# Patient Record
Sex: Male | Born: 1950 | Race: Black or African American | Hispanic: No | Marital: Married | Smoking: Never smoker
Health system: Southern US, Community
[De-identification: ages and names within clinical notes are randomized; demographics above are authoritative.]

## PROBLEM LIST (undated history)

## (undated) DIAGNOSIS — I10 Essential (primary) hypertension: Secondary | ICD-10-CM

## (undated) DIAGNOSIS — M109 Gout, unspecified: Secondary | ICD-10-CM

## (undated) DIAGNOSIS — D649 Anemia, unspecified: Secondary | ICD-10-CM

## (undated) DIAGNOSIS — E119 Type 2 diabetes mellitus without complications: Secondary | ICD-10-CM

## (undated) HISTORY — PX: APPENDECTOMY: SHX54

---

## 2019-11-05 ENCOUNTER — Ambulatory Visit: Payer: Self-pay | Admitting: Hospice and Palliative Medicine

## 2019-11-13 ENCOUNTER — Other Ambulatory Visit: Payer: Self-pay | Admitting: Family Medicine

## 2019-11-13 DIAGNOSIS — R410 Disorientation, unspecified: Secondary | ICD-10-CM

## 2019-11-18 ENCOUNTER — Ambulatory Visit: Payer: Self-pay | Admitting: Hospice and Palliative Medicine

## 2019-11-25 ENCOUNTER — Other Ambulatory Visit: Payer: Self-pay

## 2019-11-25 ENCOUNTER — Ambulatory Visit
Admission: RE | Admit: 2019-11-25 | Discharge: 2019-11-25 | Disposition: A | Discharge status: Home / Self Care | Payer: Medicare HMO | Source: Ambulatory Visit | Attending: Family Medicine | Admitting: Family Medicine

## 2019-11-25 DIAGNOSIS — R410 Disorientation, unspecified: Secondary | ICD-10-CM | POA: Diagnosis present

## 2019-12-10 ENCOUNTER — Ambulatory Visit: Payer: Self-pay | Admitting: Family Medicine

## 2019-12-19 ENCOUNTER — Ambulatory Visit (INDEPENDENT_AMBULATORY_CARE_PROVIDER_SITE_OTHER): Payer: Medicare HMO | Admitting: Urology

## 2019-12-19 ENCOUNTER — Other Ambulatory Visit: Payer: Self-pay

## 2019-12-19 ENCOUNTER — Encounter: Payer: Self-pay | Admitting: Urology

## 2019-12-19 VITALS — BP 106/66 | HR 85 | Ht 68.0 in | Wt 180.0 lb

## 2019-12-19 DIAGNOSIS — N400 Enlarged prostate without lower urinary tract symptoms: Secondary | ICD-10-CM | POA: Diagnosis not present

## 2019-12-19 DIAGNOSIS — N393 Stress incontinence (female) (male): Secondary | ICD-10-CM | POA: Diagnosis not present

## 2019-12-19 DIAGNOSIS — Z8546 Personal history of malignant neoplasm of prostate: Secondary | ICD-10-CM

## 2019-12-19 LAB — BLADDER SCAN AMB NON-IMAGING: Scan Result: 2

## 2019-12-19 NOTE — Progress Notes (Signed)
12/19/2019 1:49 PM   Darryl Stafford [birth year] [id number]  Referring provider: Wilford Corner, PA-C [address] [place],  [place] [address]  Chief Complaint  Patient presents with   Benign Prostatic Hypertrophy    HPI: Darryl Stafford is a 69 y.o. male seen at the request of Debbra Riding, PA-C for evaluation of urinary incontinence.   Prior diagnosis prostate cancer 2009 and underwent radical prostatectomy in [place]  Records not available  Had post prostatectomy incontinence and underwent a "mesh procedure" in 2011  Improvement in his incontinence however recently with recurrent urinary incontinence  Presently wearing pads.  Although denies urine leakage with coughing or sneezing he does complain of unaware incontinence  Mild urinary frequency but denies urge incontinence no dysuria or gross hematuria  Denies flank, abdominal or pelvic pain  PSA drawn September 2021 was 0.03; no prior PSAs available for comparison   PMH:  Diabetes mellitus without complication (CMS-HCC)   History of cancer 2009  prostate   Hyperlipidemia   Hypertension   Surgical History:  prostate surgery 2009  cancer   Home Medications:  Allergies as of 12/19/2019   No Known Allergies     Medication List       Accurate as of December 19, 2019  1:49 PM. If you have any questions, ask your nurse or doctor.        amLODipine 10 MG tablet Commonly known as: NORVASC Take 1 tablet by mouth daily.   atorvastatin 40 MG tablet Commonly known as: LIPITOR Take 1 tablet by mouth at bedtime.   carvedilol 25 MG tablet Commonly known as: COREG Take 1 tablet by mouth daily.   empagliflozin 25 MG Tabs tablet Commonly known as: JARDIANCE Take by mouth.   gemfibrozil 600 MG tablet Commonly known as: LOPID Take by mouth.   ibuprofen 800 MG tablet Commonly known as: ADVIL Take by mouth.   levothyroxine 25 MCG tablet Commonly known as: SYNTHROID Take 1  tablet by mouth daily.   lisinopril-hydrochlorothiazide 20-25 MG tablet Commonly known as: ZESTORETIC Take 1 tablet by mouth daily.   metFORMIN 1000 MG tablet Commonly known as: GLUCOPHAGE Take by mouth.   repaglinide 2 MG tablet Commonly known as: PRANDIN Take by mouth.       Allergies: No Known Allergies  Family History: History reviewed. No pertinent family history.  Social History:  reports that he has never smoked. He has never used smokeless tobacco. He reports that he does not drink alcohol and does not use drugs.   Physical Exam: BP 106/66    Pulse 85    Ht 5\' 6"  (1.676 m)    Wt 180 lb (81.6 kg)    BMI 29.05 kg/m   Constitutional:  Alert and oriented, No acute distress. HEENT: [place] AT, moist mucus membranes.  Trachea midline, no masses. Cardiovascular: No clubbing, cyanosis, or edema. Respiratory: Normal respiratory effort, no increased work of breathing. GI: Abdomen is soft, nontender, nondistended, no abdominal masses Skin: No rashes, bruises or suspicious lesions. Neurologic: Grossly intact, no focal deficits, moving all 4 extremities. Psychiatric: Normal mood and affect.   Assessment & Plan:    1.  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 2012 for further evaluation and discussion of treatment options  Although denies urge trial Gemtesa 75 mg to see if he notes any improvement in his incontinence while awaiting his appointment with Dr. Sherron Monday   2.  History prostate cancer  Status post radical prostatectomy  PSA undetectable  No prior PSA results for comparison  Repeat PSA 6 months   Riki Altes, MD  [facility] [address] [place], [place] [address] [phone]

## 2019-12-20 LAB — URINALYSIS, COMPLETE
Bilirubin, UA: NEGATIVE
Ketones, UA: NEGATIVE
Leukocytes,UA: NEGATIVE
Nitrite, UA: NEGATIVE
Protein,UA: NEGATIVE
RBC, UA: NEGATIVE
Specific Gravity, UA: 1.015 (ref 1.005–1.030)
Urobilinogen, Ur: 0.2 mg/dL (ref 0.2–1.0)
pH, UA: 5.5 (ref 5.0–7.5)

## 2019-12-20 LAB — MICROSCOPIC EXAMINATION
Bacteria, UA: NONE SEEN
RBC, Urine: NONE SEEN /hpf (ref 0–2)

## 2020-01-19 ENCOUNTER — Other Ambulatory Visit: Payer: Self-pay

## 2020-01-19 ENCOUNTER — Encounter: Payer: Self-pay | Admitting: Urology

## 2020-01-19 ENCOUNTER — Ambulatory Visit (INDEPENDENT_AMBULATORY_CARE_PROVIDER_SITE_OTHER): Payer: Medicare HMO | Admitting: Urology

## 2020-01-19 ENCOUNTER — Ambulatory Visit: Payer: Self-pay | Admitting: Urology

## 2020-01-19 VITALS — BP 104/67 | HR 77

## 2020-01-19 DIAGNOSIS — N3946 Mixed incontinence: Secondary | ICD-10-CM

## 2020-01-19 MED ORDER — GEMTESA 75 MG PO TABS: 75.0000 mg | ORAL_TABLET | Freq: Every day | ORAL | 2 refills | Status: DC

## 2020-01-19 NOTE — Progress Notes (Signed)
01/19/2020 2:29 PM   Darryl Stafford [birth year] [id number]  Referring provider: Wilford Corner, PA-C [address] [place],  [place] [address]  Chief Complaint  Patient presents with  . Follow-up    HPI: Sto[place]ff: Patient had radical prostatectomy in 2009.  He likely had a mesh male sling in 2011.  PSA September 2021 0.03.  Was given a trial of the new beta 3 agonist  The patient surgery started after his surgery in 2009.  It was somewhat improved after male sling temporarily.  Currently he leaks with activity but not coughing sneezing bending lifting.  He has urge incontinence.  He has no bedwetting.  He wears 2 damp pads a day.  He has a 50% reduction or greater on the new beta 3 agonist and is happy on it  Flow was reasonable.  Right-handed.  He has had a left hernia repair with no mesh.  He is on oral hypoglycemics with normal bowel movements  Negative cough test in the standing position with normal male genitalia   PMH: No past medical history on file.  Surgical History: No past surgical history on file.  Home Medications:  Allergies as of 01/19/2020   No Known Allergies     Medication List       Accurate as of January 19, 2020  2:29 PM. If you have any questions, ask your nurse or doctor.        amLODipine 10 MG tablet Commonly known as: NORVASC Take 1 tablet by mouth daily.   atorvastatin 40 MG tablet Commonly known as: LIPITOR Take 1 tablet by mouth at bedtime.   carvedilol 25 MG tablet Commonly known as: COREG Take 1 tablet by mouth daily.   empagliflozin 25 MG Tabs tablet Commonly known as: JARDIANCE Take by mouth.   gemfibrozil 600 MG tablet Commonly known as: LOPID Take by mouth.   Gemtesa 75 MG Tabs Generic drug: Vibegron Take by mouth.   ibuprofen 800 MG tablet Commonly known as: ADVIL Take by mouth.   levothyroxine 25 MCG tablet Commonly known as: SYNTHROID Take 1 tablet by mouth daily.     lisinopril-hydrochlorothiazide 20-25 MG tablet Commonly known as: ZESTORETIC Take 1 tablet by mouth daily.   metFORMIN 1000 MG tablet Commonly known as: GLUCOPHAGE Take by mouth.   pioglitazone 15 MG tablet Commonly known as: ACTOS Take by mouth.   repaglinide 2 MG tablet Commonly known as: PRANDIN Take by mouth.       Allergies: No Known Allergies  Family History: No family history on file.  Social History:  reports that he has never smoked. He has never used smokeless tobacco. He reports that he does not drink alcohol and does not use drugs.  ROS:                                        Physical Exam: BP 104/67   Pulse 77   Constitutional:  Alert and oriented, No acute distress. HEENT: [place] AT, moist mucus membranes.  Trachea midline, no masses. Cardiovascular: No clubbing, cyanosis, or edema. Respiratory: Normal respiratory effort, no increased work of breathing. GI: Abdomen is soft, nontender, nondistended, no abdominal masses  Laboratory Data: No results found for: WBC, HGB, HCT, MCV, PLT  No results found for: CREATININE  No results found for: PSA  No results found for: TESTOSTERONE  No results found for: HGBA1C  Urinalysis  Component Value Date/Time   APPEARANCEUR Clear 12/19/2019 1404   GLUCOSEU 3+ (A) 12/19/2019 1404   BILIRUBINUR Negative 12/19/2019 1404   PROTEINUR Negative 12/19/2019 1404   NITRITE Negative 12/19/2019 1404   LEUKOCYTESUR Negative 12/19/2019 1404    Pertinent Imaging:   Assessment & Plan: Pathophysiology and work-up for incontinence discussed.  I would like to have him on a beta 3 agonist because of his age and history of hypertension and risk factors for cognitive impairment with antimuscarinics.  He has responded very well to this.  He is at risk of hypertension with Myrbetriq.  Samples and prescription given.  I think working him up may be a little bit aggressive at this stage and we discussed his  goals.  He reports only leaking a few drops on this medication and both he and his wife agreed not to proceed with testing or future surgery at this stage.  Assessed 3 months  There are no diagnoses linked to this encounter.  No follow-ups on file.  Martina Sinner, MD  [facility] [address], [address] [place], [place] [address] [phone]

## 2020-03-22 ENCOUNTER — Ambulatory Visit: Payer: Self-pay | Admitting: Urology

## 2020-04-05 ENCOUNTER — Ambulatory Visit: Payer: Self-pay | Admitting: Urology

## 2020-04-26 ENCOUNTER — Ambulatory Visit (INDEPENDENT_AMBULATORY_CARE_PROVIDER_SITE_OTHER): Payer: Medicare HMO | Admitting: Urology

## 2020-04-26 ENCOUNTER — Encounter: Payer: Self-pay | Admitting: Urology

## 2020-04-26 ENCOUNTER — Other Ambulatory Visit: Payer: Self-pay

## 2020-04-26 VITALS — BP 154/76 | HR 64

## 2020-04-26 DIAGNOSIS — N3946 Mixed incontinence: Secondary | ICD-10-CM | POA: Diagnosis not present

## 2020-04-26 MED ORDER — GEMTESA 75 MG PO TABS: 75.0000 mg | ORAL_TABLET | Freq: Every day | ORAL | 2 refills | Status: AC

## 2020-04-26 NOTE — Progress Notes (Signed)
04/26/2020 9:33 AM   Darryl Stafford 09-Dec-1950 [id number]  Referring provider: Wilford Corner, PA-C [address] [place],  [place] [address]  Chief Complaint  Patient presents with  . Follow-up    HPI: Stoioff: Patient had radical prostatectomy in 2009.  He likely had a mesh male sling in 2011.  PSA September 2021 0.03.  Was given a trial of the new beta 3 agonist  The patient surgery started after his surgery in 2009.  It was somewhat improved after male sling temporarily.  Currently he leaks with activity but not coughing sneezing bending lifting.  He has urge incontinence.  He has no bedwetting.  He wears 2 damp pads a day.  He has a 50% reduction or greater on the new beta 3 agonist and is happy on it  Flow was reasonable.  Right-handed.  He has had a left hernia repair with no mesh.  He is on oral hypoglycemics with normal bowel movements  Negative cough test in the standing position with normal male genitalia  Pathophysiology and work-up for incontinence discussed.  I would like to have him on a beta 3 agonist because of his age and history of hypertension and risk factors for cognitive impairment with antimuscarinics.  He has responded very well to this.  He is at risk of hypertension with Myrbetriq.  Samples and prescription given.  I think working him up may be a little bit aggressive at this stage and we discussed his goals.  He reports only leaking a few drops on this medication and both he and his wife agreed not to proceed with testing or future surgery at this stage.  Assessed 3 months  Today Patient is very pleased on the [place].  His urge incontinence is dramatically better.  It is improved his quality life.  Frequency improved.  No infections.  Samples given.  Renewed prescription.  He is at risk of hypertension with Myrbetriq and cognitive impairment with antimuscarinics and I recommend a new beta 3 agonist  PMH: No past medical history  on file.  Surgical History: No past surgical history on file.  Home Medications:  Allergies as of 04/26/2020   No Known Allergies     Medication List       Accurate as of April 26, 2020  9:33 AM. If you have any questions, ask your nurse or doctor.        amLODipine 10 MG tablet Commonly known as: NORVASC Take 1 tablet by mouth daily.   atorvastatin 40 MG tablet Commonly known as: LIPITOR Take 1 tablet by mouth at bedtime.   carvedilol 25 MG tablet Commonly known as: COREG Take 1 tablet by mouth daily.   empagliflozin 25 MG Tabs tablet Commonly known as: JARDIANCE Take by mouth.   gemfibrozil 600 MG tablet Commonly known as: LOPID Take by mouth.   Gemtesa 75 MG Tabs Generic drug: Vibegron Take 75 mg by mouth daily.   ibuprofen 800 MG tablet Commonly known as: ADVIL Take by mouth.   levothyroxine 25 MCG tablet Commonly known as: SYNTHROID Take 1 tablet by mouth daily.   lisinopril-hydrochlorothiazide 20-25 MG tablet Commonly known as: ZESTORETIC Take 1 tablet by mouth daily.   metFORMIN 1000 MG tablet Commonly known as: GLUCOPHAGE Take by mouth.   pioglitazone 15 MG tablet Commonly known as: ACTOS Take by mouth.   repaglinide 2 MG tablet Commonly known as: PRANDIN Take by mouth.       Allergies: No Known Allergies  Family  History: No family history on file.  Social History:  reports that he has never smoked. He has never used smokeless tobacco. He reports that he does not drink alcohol and does not use drugs.  ROS:                                        Physical Exam: BP (!) 154/76   Pulse 64   Constitutional:  Alert and oriented, No acute distress. HEENT: [place] AT, moist mucus membranes.  Trachea midline, no masses.   Laboratory Data: No results found for: WBC, HGB, HCT, MCV, PLT  No results found for: CREATININE  No results found for: PSA  No results found for: TESTOSTERONE  No results found for:  HGBA1C  Urinalysis    Component Value Date/Time   APPEARANCEUR Clear 12/19/2019 1404   GLUCOSEU 3+ (A) 12/19/2019 1404   BILIRUBINUR Negative 12/19/2019 1404   PROTEINUR Negative 12/19/2019 1404   NITRITE Negative 12/19/2019 1404   LEUKOCYTESUR Negative 12/19/2019 1404    Pertinent Imaging:   Assessment & Plan: Prescription renewed and see in 1 year  There are no diagnoses linked to this encounter.  No follow-ups on file.  Martina Sinner, MD  [facility] [address], [address] [place], [place] [address] [phone]

## 2020-07-14 ENCOUNTER — Other Ambulatory Visit
Admission: RE | Admit: 2020-07-14 | Discharge: 2020-07-14 | Disposition: A | Discharge status: Home / Self Care | Payer: Medicare HMO | Source: Ambulatory Visit | Attending: Rheumatology | Admitting: Rheumatology

## 2020-07-14 DIAGNOSIS — M1A09X0 Idiopathic chronic gout, multiple sites, without tophus (tophi): Secondary | ICD-10-CM | POA: Diagnosis present

## 2020-07-14 DIAGNOSIS — M25461 Effusion, right knee: Secondary | ICD-10-CM | POA: Insufficient documentation

## 2020-07-14 LAB — SYNOVIAL CELL COUNT + DIFF, W/ CRYSTALS
Crystals, Fluid: NONE SEEN
Eosinophils-Synovial: 0 %
Lymphocytes-Synovial Fld: 19 %
Monocyte-Macrophage-Synovial Fluid: 29 %
Neutrophil, Synovial: 52 %
WBC, Synovial: 279 /mm3 — ABNORMAL HIGH (ref 0–200)

## 2020-07-17 ENCOUNTER — Emergency Department: Payer: Medicare HMO

## 2020-07-17 ENCOUNTER — Observation Stay
Admission: EM | Admit: 2020-07-17 | Discharge: 2020-07-18 | Disposition: A | Discharge status: Home / Self Care | Payer: Medicare HMO | Attending: Internal Medicine | Admitting: Internal Medicine

## 2020-07-17 ENCOUNTER — Other Ambulatory Visit: Payer: Self-pay

## 2020-07-17 DIAGNOSIS — E872 Acidosis, unspecified: Secondary | ICD-10-CM | POA: Diagnosis present

## 2020-07-17 DIAGNOSIS — Z7984 Long term (current) use of oral hypoglycemic drugs: Secondary | ICD-10-CM | POA: Diagnosis not present

## 2020-07-17 DIAGNOSIS — Z20822 Contact with and (suspected) exposure to covid-19: Secondary | ICD-10-CM | POA: Insufficient documentation

## 2020-07-17 DIAGNOSIS — I10 Essential (primary) hypertension: Secondary | ICD-10-CM | POA: Diagnosis present

## 2020-07-17 DIAGNOSIS — E876 Hypokalemia: Secondary | ICD-10-CM | POA: Diagnosis present

## 2020-07-17 DIAGNOSIS — R197 Diarrhea, unspecified: Secondary | ICD-10-CM | POA: Insufficient documentation

## 2020-07-17 DIAGNOSIS — R55 Syncope and collapse: Secondary | ICD-10-CM | POA: Diagnosis not present

## 2020-07-17 DIAGNOSIS — R778 Other specified abnormalities of plasma proteins: Secondary | ICD-10-CM | POA: Diagnosis not present

## 2020-07-17 DIAGNOSIS — E785 Hyperlipidemia, unspecified: Secondary | ICD-10-CM

## 2020-07-17 DIAGNOSIS — R112 Nausea with vomiting, unspecified: Secondary | ICD-10-CM

## 2020-07-17 DIAGNOSIS — R1110 Vomiting, unspecified: Secondary | ICD-10-CM | POA: Insufficient documentation

## 2020-07-17 DIAGNOSIS — Z79899 Other long term (current) drug therapy: Secondary | ICD-10-CM | POA: Insufficient documentation

## 2020-07-17 DIAGNOSIS — R42 Dizziness and giddiness: Secondary | ICD-10-CM

## 2020-07-17 DIAGNOSIS — R7989 Other specified abnormal findings of blood chemistry: Secondary | ICD-10-CM

## 2020-07-17 DIAGNOSIS — I4891 Unspecified atrial fibrillation: Secondary | ICD-10-CM | POA: Diagnosis not present

## 2020-07-17 DIAGNOSIS — E039 Hypothyroidism, unspecified: Secondary | ICD-10-CM | POA: Diagnosis not present

## 2020-07-17 DIAGNOSIS — N179 Acute kidney failure, unspecified: Secondary | ICD-10-CM

## 2020-07-17 DIAGNOSIS — E119 Type 2 diabetes mellitus without complications: Secondary | ICD-10-CM | POA: Insufficient documentation

## 2020-07-17 DIAGNOSIS — E860 Dehydration: Secondary | ICD-10-CM

## 2020-07-17 DIAGNOSIS — M109 Gout, unspecified: Secondary | ICD-10-CM | POA: Diagnosis present

## 2020-07-17 DIAGNOSIS — E1169 Type 2 diabetes mellitus with other specified complication: Secondary | ICD-10-CM

## 2020-07-17 DIAGNOSIS — I480 Paroxysmal atrial fibrillation: Secondary | ICD-10-CM | POA: Diagnosis not present

## 2020-07-17 DIAGNOSIS — R531 Weakness: Secondary | ICD-10-CM | POA: Diagnosis present

## 2020-07-17 HISTORY — DX: Anemia, unspecified: D64.9

## 2020-07-17 HISTORY — DX: Gout, unspecified: M10.9

## 2020-07-17 HISTORY — DX: Type 2 diabetes mellitus without complications: E11.9

## 2020-07-17 HISTORY — DX: Essential (primary) hypertension: I10

## 2020-07-17 LAB — RESP PANEL BY RT-PCR (FLU A&B, COVID) ARPGX2
Influenza A by PCR: NEGATIVE
Influenza B by PCR: NEGATIVE
SARS Coronavirus 2 by RT PCR: NEGATIVE

## 2020-07-17 LAB — CBC WITH DIFFERENTIAL/PLATELET
Abs Immature Granulocytes: 0.3 10*3/uL — ABNORMAL HIGH (ref 0.00–0.07)
Basophils Absolute: 0.1 10*3/uL (ref 0.0–0.1)
Basophils Relative: 1 %
Eosinophils Absolute: 0 10*3/uL (ref 0.0–0.5)
Eosinophils Relative: 0 %
HCT: 33.2 % — ABNORMAL LOW (ref 39.0–52.0)
Hemoglobin: 10.2 g/dL — ABNORMAL LOW (ref 13.0–17.0)
Immature Granulocytes: 3 %
Lymphocytes Relative: 16 %
Lymphs Abs: 1.6 10*3/uL (ref 0.7–4.0)
MCH: 21.8 pg — ABNORMAL LOW (ref 26.0–34.0)
MCHC: 30.7 g/dL (ref 30.0–36.0)
MCV: 70.9 fL — ABNORMAL LOW (ref 80.0–100.0)
Monocytes Absolute: 0.9 10*3/uL (ref 0.1–1.0)
Monocytes Relative: 9 %
Neutro Abs: 7 10*3/uL (ref 1.7–7.7)
Neutrophils Relative %: 71 %
Platelets: 344 10*3/uL (ref 150–400)
RBC: 4.68 MIL/uL (ref 4.22–5.81)
RDW: 16.7 % — ABNORMAL HIGH (ref 11.5–15.5)
WBC: 10 10*3/uL (ref 4.0–10.5)
nRBC: 0.8 % — ABNORMAL HIGH (ref 0.0–0.2)

## 2020-07-17 LAB — COMPREHENSIVE METABOLIC PANEL
ALT: 20 U/L (ref 0–44)
AST: 21 U/L (ref 15–41)
Albumin: 3.3 g/dL — ABNORMAL LOW (ref 3.5–5.0)
Alkaline Phosphatase: 82 U/L (ref 38–126)
Anion gap: 11 (ref 5–15)
BUN: 27 mg/dL — ABNORMAL HIGH (ref 8–23)
CO2: 21 mmol/L — ABNORMAL LOW (ref 22–32)
Calcium: 8.7 mg/dL — ABNORMAL LOW (ref 8.9–10.3)
Chloride: 105 mmol/L (ref 98–111)
Creatinine, Ser: 1.28 mg/dL — ABNORMAL HIGH (ref 0.61–1.24)
GFR, Estimated: >60 mL/min (ref >60)
Glucose, Bld: 260 mg/dL — ABNORMAL HIGH (ref 70–99)
Potassium: 3.4 mmol/L — ABNORMAL LOW (ref 3.5–5.1)
Sodium: 137 mmol/L (ref 135–145)
Total Bilirubin: 0.8 mg/dL (ref 0.3–1.2)
Total Protein: 7.1 g/dL (ref 6.5–8.1)

## 2020-07-17 LAB — LACTIC ACID, PLASMA
Lactic Acid, Venous: 2.8 mmol/L (ref 0.5–1.9)
Lactic Acid, Venous: 2.3 mmol/L (ref 0.5–1.9)
Lactic Acid, Venous: 2.1 mmol/L (ref 0.5–1.9)
Lactic Acid, Venous: 2.1 mmol/L (ref 0.5–1.9)

## 2020-07-17 LAB — IRON AND TIBC
Iron: 106 ug/dL (ref 45–182)
Saturation Ratios: 30 % (ref 17.9–39.5)
TIBC: 356 ug/dL (ref 250–450)
UIBC: 250 ug/dL

## 2020-07-17 LAB — LIPASE, BLOOD: Lipase: 35 U/L (ref 11–51)

## 2020-07-17 LAB — CBG MONITORING, ED: Glucose-Capillary: 144 mg/dL — ABNORMAL HIGH (ref 70–99)

## 2020-07-17 LAB — HEMOGLOBIN A1C
Hgb A1c MFr Bld: 8.4 % — ABNORMAL HIGH (ref 4.8–5.6)
Mean Plasma Glucose: 194.38 mg/dL

## 2020-07-17 LAB — MAGNESIUM: Magnesium: 2.2 mg/dL (ref 1.7–2.4)

## 2020-07-17 LAB — TSH: TSH: 0.915 u[IU]/mL (ref 0.350–4.500)

## 2020-07-17 LAB — TROPONIN I (HIGH SENSITIVITY)
Troponin I (High Sensitivity): 206 ng/L (ref <18)
Troponin I (High Sensitivity): 237 ng/L (ref <18)
Troponin I (High Sensitivity): 283 ng/L (ref <18)

## 2020-07-17 LAB — GLUCOSE, CAPILLARY: Glucose-Capillary: 151 mg/dL — ABNORMAL HIGH (ref 70–99)

## 2020-07-17 MED ORDER — INSULIN ASPART 100 UNIT/ML IJ SOLN
0.0000 [IU] | Freq: Three times a day (TID) | INTRAMUSCULAR | Status: DC
  Administered 2020-07-17: 2 [IU] via SUBCUTANEOUS
  Filled 2020-07-17: qty 1

## 2020-07-17 MED ORDER — ATORVASTATIN CALCIUM 80 MG PO TABS
80.0000 mg | ORAL_TABLET | Freq: Every day | ORAL | Status: DC
  Administered 2020-07-17: 80 mg via ORAL
  Filled 2020-07-17: qty 1

## 2020-07-17 MED ORDER — MIRABEGRON ER 25 MG PO TB24
25.0000 mg | ORAL_TABLET | Freq: Every day | ORAL | Status: DC
  Administered 2020-07-18: 25 mg via ORAL
  Filled 2020-07-17: qty 1

## 2020-07-17 MED ORDER — LEVOTHYROXINE SODIUM 25 MCG PO TABS
25.0000 ug | ORAL_TABLET | Freq: Every day | ORAL | Status: DC
  Administered 2020-07-18: 25 ug via ORAL
  Filled 2020-07-17: qty 1

## 2020-07-17 MED ORDER — POTASSIUM CHLORIDE CRYS ER 20 MEQ PO TBCR
40.0000 meq | EXTENDED_RELEASE_TABLET | Freq: Once | ORAL | Status: AC
  Administered 2020-07-17: 40 meq via ORAL
  Filled 2020-07-17: qty 2

## 2020-07-17 MED ORDER — GEMFIBROZIL 600 MG PO TABS
600.0000 mg | ORAL_TABLET | Freq: Two times a day (BID) | ORAL | Status: DC
  Administered 2020-07-18: 600 mg via ORAL
  Filled 2020-07-17 (×5): qty 1

## 2020-07-17 MED ORDER — ACETAMINOPHEN 325 MG PO TABS: 650.0000 mg | ORAL_TABLET | ORAL | Status: DC | PRN

## 2020-07-17 MED ORDER — ASPIRIN EC 81 MG PO TBEC
81.0000 mg | DELAYED_RELEASE_TABLET | Freq: Once | ORAL | Status: AC
  Administered 2020-07-17: 81 mg via ORAL
  Filled 2020-07-17: qty 1

## 2020-07-17 MED ORDER — SODIUM CHLORIDE 0.9 % IV BOLUS
1000.0000 mL | Freq: Once | INTRAVENOUS | Status: AC
  Administered 2020-07-17: 1000 mL via INTRAVENOUS

## 2020-07-17 MED ORDER — SODIUM CHLORIDE 0.9 % IV SOLN: INTRAVENOUS | Status: DC

## 2020-07-17 MED ORDER — ALUM & MAG HYDROXIDE-SIMETH 200-200-20 MG/5ML PO SUSP
30.0000 mL | ORAL | Status: DC | PRN
  Administered 2020-07-17: 30 mL via ORAL
  Filled 2020-07-17: qty 30

## 2020-07-17 MED ORDER — APIXABAN 5 MG PO TABS
5.0000 mg | ORAL_TABLET | Freq: Two times a day (BID) | ORAL | Status: DC
  Administered 2020-07-17 – 2020-07-18 (×2): 5 mg via ORAL
  Filled 2020-07-17 (×2): qty 1

## 2020-07-17 MED ORDER — ONDANSETRON HCL 4 MG/2ML IJ SOLN: 4.0000 mg | Freq: Four times a day (QID) | INTRAMUSCULAR | Status: DC | PRN

## 2020-07-17 MED ORDER — VIBEGRON 75 MG PO TABS: 75.0000 mg | ORAL_TABLET | Freq: Every day | ORAL | Status: DC

## 2020-07-17 MED ORDER — CARVEDILOL 6.25 MG PO TABS
6.2500 mg | ORAL_TABLET | Freq: Two times a day (BID) | ORAL | Status: DC
  Administered 2020-07-18: 6.25 mg via ORAL
  Filled 2020-07-17 (×2): qty 1

## 2020-07-17 NOTE — ED Notes (Signed)
Called lab to check on labs sent to verify that they are running.

## 2020-07-17 NOTE — H&P (Addendum)
History and Physical    Kylle Lall MCN:[id number] DOB: Jul 25, 1950 DOA: 07/17/2020  PCP: Wilford Corner, PA-C   Patient coming from: Home  I have personally briefly reviewed patient's old medical records in [facility]  Chief Complaint: Weakness  HPI: Darryl Stafford is a 70 y.o. male with medical history significant for gout, diabetes mellitus, hypertension who presents to the ER via EMS for evaluation of weakness and a near syncopal episode. Patient states that he was recently started on colchicine about 2 days prior to his admission for an acute gout flare and since then he has had multiple episodes of diarrhea and 1 episode of emesis.  He denies having any abdominal pain.  He denies having any hematemesis, melena stools or hematochezia.  He complains of dizziness and lightheadedness and was walking to the bathroom 1 day prior to his admission when he fell and landed on his behind.  There was no head trauma, no loss of consciousness or any injury from that fall. He denies having any chest pain, no abdominal pain, no fever, no chills, no headache, no urinary symptoms, no palpitations, no diaphoresis, no shortness of breath, no focal deficits. Labs revealed sodium 137, potassium 3.4, chloride 105, bicarb 21, glucose 260, BUN 27, creatinine 1.28, calcium 8.7, alkaline phosphatase 82, albumin 3.3, lipase 35, AST 21, ALT 20, total protein 7.1, lactic acid 2.8, white count 10.0, hemoglobin 10.2, hematocrit 33.2, MCV 70.9, RDW 16.7, platelet count 344 CT scan of abdomen pelvis shows no acute noncontrast CT findings of the abdomen or pelvis to explain diarrhea. Descending and sigmoid diverticulosis without evidence of acute Diverticulitis. Evidence of prior left inguinal hernia repair. Twelve-lead EKG shows A. fib with T wave abnormalities in the inferior leads.   ED Course: Patient is a 70 year old African-American male who presents to the ER for evaluation of a near syncopal  episode, weakness and diarrhea following initiation of colchicine for an acute gout flare.  Patient was in rapid A. fib when he arrived to the emergency room which appears to be new and received 2 L IV fluid bolus with his heart rate in the low 100s.  He also has orthostatic blood pressure changes and gets dizzy and lightheaded when he sits up. He will be admitted to the hospital for further evaluation.     Review of Systems: As per HPI otherwise all other systems reviewed and negative.    Past Medical History:  Diagnosis Date  . Anemia   . Diabetes mellitus without complication (HCC)   . Gout   . Hypertension     Past Surgical History:  Procedure Laterality Date  . APPENDECTOMY       reports that he has never smoked. He has never used smokeless tobacco. He reports that he does not drink alcohol and does not use drugs.  No Known Allergies  Family History  Problem Relation Age of Onset  . Hypertension Mother       Prior to Admission medications   Medication Sig Start Date End Date Taking? Authorizing Provider  amLODipine (NORVASC) 10 MG tablet Take 1 tablet by mouth daily. 10/23/19   [provider]  atorvastatin (LIPITOR) 40 MG tablet Take 1 tablet by mouth at bedtime. 10/23/19   [provider]  carvedilol (COREG) 25 MG tablet Take 1 tablet by mouth daily. 10/23/19   [provider]  empagliflozin (JARDIANCE) 25 MG TABS tablet Take by mouth. 12/02/19   [provider]  gemfibrozil (LOPID) 600 MG tablet  Take by mouth. 12/02/19   [provider]  ibuprofen (ADVIL) 800 MG tablet Take by mouth.    [provider]  levothyroxine (SYNTHROID) 25 MCG tablet Take 1 tablet by mouth daily. 10/13/19   [provider]  lisinopril-hydrochlorothiazide (ZESTORETIC) 20-25 MG tablet Take 1 tablet by mouth daily. 12/19/19   [provider]  metFORMIN (GLUCOPHAGE) 1000 MG tablet Take by mouth.    [provider]   pioglitazone (ACTOS) 15 MG tablet Take by mouth. 01/08/20 01/07/21  [provider]  repaglinide (PRANDIN) 2 MG tablet Take by mouth.    [provider]  Vibegron (GEMTESA) 75 MG TABS Take 75 mg by mouth daily. 04/26/20   Alfredo Martinez, MD    Physical Exam: Vitals:   07/17/20 1345 07/17/20 1400 07/17/20 1515 07/17/20 1530  BP: 97/61 (!) 135/119 108/65 123/66  Pulse: 85 99 (!) 104 (!) 107  Resp: (!) 23 (!) 22 15 17   Temp:      TempSrc:      SpO2: 94% 95% 95% 93%  Weight:      Height:         Vitals:   07/17/20 1345 07/17/20 1400 07/17/20 1515 07/17/20 1530  BP: 97/61 (!) 135/119 108/65 123/66  Pulse: 85 99 (!) 104 (!) 107  Resp: (!) 23 (!) 22 15 17   Temp:      TempSrc:      SpO2: 94% 95% 95% 93%  Weight:      Height:          Constitutional: Alert and oriented x 3 . Not in any apparent distress HEENT:      Head: Normocephalic and atraumatic.         Eyes: PERLA, EOMI, Conjunctivae are normal. Sclera is non-icteric.       Mouth/Throat: Mucous membranes are moist.       Neck: Supple with no signs of meningismus. Cardiovascular:  Irregularly irregular, tachycardic. No murmurs, gallops, or rubs. 2+ symmetrical distal pulses are present . No JVD. No LE edema Respiratory: Respiratory effort normal .Lungs sounds clear bilaterally. No wheezes, crackles, or rhonchi.  Gastrointestinal: Soft, non tender, and non distended with positive bowel sounds.  Genitourinary: No CVA tenderness. Musculoskeletal: Nontender with normal range of motion in all extremities. No cyanosis, or erythema of extremities. Neurologic:  Face is symmetric. Moving all extremities. No gross focal neurologic deficits.  Generalized weakness Skin: Skin is warm, dry.  No rash or ulcer Psychiatric: Mood and affect are normal   Labs on Admission: I have personally reviewed following labs and imaging studies  CBC: Recent Labs  Lab 07/17/20 1332  WBC 10.0  NEUTROABS 7.0  HGB 10.2*  HCT  33.2*  MCV 70.9*  PLT 344   Basic Metabolic Panel: Recent Labs  Lab 07/17/20 1332  NA 137  K 3.4*  CL 105  CO2 21*  GLUCOSE 260*  BUN 27*  CREATININE 1.28*  CALCIUM 8.7*   GFR: Estimated Creatinine Clearance: 59 mL/min (A) (by C-G formula based on SCr of 1.28 mg/dL (H)). Liver Function Tests: Recent Labs  Lab 07/17/20 1332  AST 21  ALT 20  ALKPHOS 82  BILITOT 0.8  PROT 7.1  ALBUMIN 3.3*   Recent Labs  Lab 07/17/20 1332  LIPASE 35   No results for input(s): AMMONIA in the last 168 hours. Coagulation Profile: No results for input(s): INR, PROTIME in the last 168 hours. Cardiac Enzymes: No results for input(s): CKTOTAL, CKMB, CKMBINDEX, TROPONINI in the last  168 hours. BNP (last 3 results) No results for input(s): PROBNP in the last 8760 hours. HbA1C: No results for input(s): HGBA1C in the last 72 hours. CBG: No results for input(s): GLUCAP in the last 168 hours. Lipid Profile: No results for input(s): CHOL, HDL, LDLCALC, TRIG, CHOLHDL, LDLDIRECT in the last 72 hours. Thyroid Function Tests: No results for input(s): TSH, T4TOTAL, FREET4, T3FREE, THYROIDAB in the last 72 hours. Anemia Panel: No results for input(s): VITAMINB12, FOLATE, FERRITIN, TIBC, IRON, RETICCTPCT in the last 72 hours. Urine analysis:    Component Value Date/Time   APPEARANCEUR Clear 12/19/2019 1404   GLUCOSEU 3+ (A) 12/19/2019 1404   BILIRUBINUR Negative 12/19/2019 1404   PROTEINUR Negative 12/19/2019 1404   NITRITE Negative 12/19/2019 1404   LEUKOCYTESUR Negative 12/19/2019 1404    Radiological Exams on Admission: CT ABDOMEN PELVIS WO CONTRAST  Result Date: 07/17/2020 CLINICAL DATA:  Diarrhea and vomiting for 2 days EXAM: CT ABDOMEN AND PELVIS WITHOUT CONTRAST TECHNIQUE: Multidetector CT imaging of the abdomen and pelvis was performed following the standard protocol without IV contrast. COMPARISON:  None. FINDINGS: Lower chest: No acute abnormality. Hepatobiliary: No solid liver  abnormality is seen. No gallstones, gallbladder wall thickening, or biliary dilatation. Pancreas: Unremarkable. No pancreatic ductal dilatation or surrounding inflammatory changes. Spleen: Normal in size without significant abnormality. Adrenals/Urinary Tract: Adrenal glands are unremarkable. Fluid attenuation cyst of the superior pole left kidney. Kidneys are otherwise normal, without renal calculi, solid lesion, or hydronephrosis. Bladder is unremarkable. Stomach/Bowel: Stomach is within normal limits. Appendix appears normal. No evidence of bowel wall thickening, distention, or inflammatory changes. Descending and sigmoid diverticulosis. Vascular/Lymphatic: Aortic atherosclerosis. No enlarged abdominal or pelvic lymph nodes. Reproductive: No mass or other significant abnormality. Other: Evidence of prior left inguinal hernia repair. No abdominopelvic ascites. Musculoskeletal: No acute or significant osseous findings. IMPRESSION: 1. No acute noncontrast CT findings of the abdomen or pelvis to explain diarrhea. 2. Descending and sigmoid diverticulosis without evidence of acute diverticulitis. 3. Evidence of prior left inguinal hernia repair. Aortic Atherosclerosis (ICD10-I70.0). Electronically Signed   By: Lauralyn PrimesAlex  Bibbey M.D.   On: 07/17/2020 14:52     Assessment/Plan Principal Problem:   Atrial fibrillation with rapid ventricular response (HCC) Active Problems:   Gout   Type 2 diabetes mellitus without complication (HCC)   Postural dizziness with near syncope   Essential hypertension   Lactic acidosis   Hypokalemia   Hypothyroidism   Elevated troponin I level     Atrial fibrillation with rapid ventricular rate New onset most likely related to volume depletion from GI losses Patient's heart rate remains in the low 100s after 2 L of IV fluid bolus Resume carvedilol but at a decreased dose for rate control Continue IV fluid hydration Patient has a CHA2DS2-VASc score of 3 and ideally requires  long-term anticoagulation as primary prophylaxis for an acute stroke We will start patient on Eliquis 5 mg p.o. twice daily Obtain 2D echocardiogram to assess LVEF and rule out valvular pathology     Postural dizziness with near syncope Patient with orthostatic blood pressure changes secondary to GI loss from diarrhea Hold amlodipine, lisinopril and hydrochlorothiazide due to relative hypotension Continue IV fluid resuscitation    Hypokalemia Secondary to GI losses from diarrhea as well as concomitant diuretic. Supplement potassium Check magnesium levels     Diabetes mellitus Hold metformin, Actos, Prandin and Jardiance Maintain consistent carbohydrate diet Sliding scale coverage with insulin, Accu-Cheks before meals and at bedtime     Lactic acidosis Most  likely secondary to metformin use No evidence of an infectious process at this time We will trend lactic acid levels    Hypothyroidism Continue Synthroid   Hypertension Patient on multiple antihypertensive medications will be placed on hold due to relative hypotension.    Elevated troponin Most likely secondary to demand ischemia from tachycardia We will cycle cardiac enzymes Continue beta-blocker, statins and aspirin  DVT prophylaxis: Eliquis Code Status: full code Family Communication: Greater than 50% of time was spent discussing patient's condition and plan of care with him and his wife at the bedside.  All questions and concerns have been addressed.  He verbalized understanding and agree with the plan. Disposition Plan: Back to previous home environment Consults called: none Status: At the time of admission, it appears that the appropriate admission status for this patient is inpatient. This is judged to be reasonable and necessary in order to provide the required and to ensure the patient's safety given the presenting symptoms, and initial radiographic and laboratory data in the context of their  comorbid conditions. Patient requires inpatient status due to high intensity of service, high risk for further deterioration and high frequency of surveillance required.    Lucile Shutters MD [facility]     07/17/2020, 4:54 PM

## 2020-07-17 NOTE — ED Notes (Signed)
hospitalist at bedside

## 2020-07-17 NOTE — ED Notes (Signed)
Lab advised they stated they had the mag and TSH

## 2020-07-17 NOTE — Plan of Care (Signed)
  Problem: Education: Goal: Knowledge of condition and prescribed therapy will improve Outcome: Progressing   Problem: Cardiac: Goal: Will achieve and/or maintain adequate cardiac output Outcome: Progressing   Problem: Physical Regulation: Goal: Complications related to the disease process, condition or treatment will be avoided or minimized Outcome: Progressing   

## 2020-07-17 NOTE — ED Provider Notes (Signed)
[facility] Emergency Department Provider Note  ____________________________________________   Event Date/Time   First MD Initiated Contact with Patient 07/17/20 1320     (approximate)  I have reviewed the triage vital signs and the nursing notes.   HISTORY  Chief Complaint Weakness    HPI Darryl Stafford is a 70 y.o. male    with past medical history of gout, rheumatoid arthritis, recently started on colchicine, here with generalized weakness.  The patient was just started on colchicine several days ago.  He states that the next day after starting this, he developed abdominal cramping, nausea, and diarrhea.  He has had profuse diarrhea since then.  He states that over the last several days, particularly over the last 24 hours, he has had progressive shortness of breath, weakness, as well as lightheadedness with standing.  He has had multiple falls today due to generalized weakness.  He does not believe he hit his head.  No syncope.  He states when he tries to walk or stand up, he just feels drained and that he needs to sit on the ground.  He denies any chest pain, but has felt like his heart has been fluttering.  No cough or shortness of breath.  He does have some mild diffuse abdominal cramping and did vomit once prior to arrival.  No known fevers.       Past Medical History:  Diagnosis Date  . Gout     Patient Active Problem List   Diagnosis Date Noted  . Atrial fibrillation with rapid ventricular response (HCC) 07/17/2020    Past Surgical History:  Procedure Laterality Date  . APPENDECTOMY      Prior to Admission medications   Medication Sig Start Date End Date Taking? Authorizing Provider  amLODipine (NORVASC) 10 MG tablet Take 1 tablet by mouth daily. 10/23/19   [provider]  atorvastatin (LIPITOR) 40 MG tablet Take 1 tablet by mouth at bedtime. 10/23/19   [provider]  carvedilol (COREG) 25 MG tablet Take 1 tablet by  mouth daily. 10/23/19   [provider]  empagliflozin (JARDIANCE) 25 MG TABS tablet Take by mouth. 12/02/19   [provider]  gemfibrozil (LOPID) 600 MG tablet Take by mouth. 12/02/19   [provider]  ibuprofen (ADVIL) 800 MG tablet Take by mouth.    [provider]  levothyroxine (SYNTHROID) 25 MCG tablet Take 1 tablet by mouth daily. 10/13/19   [provider]  lisinopril-hydrochlorothiazide (ZESTORETIC) 20-25 MG tablet Take 1 tablet by mouth daily. 12/19/19   [provider]  metFORMIN (GLUCOPHAGE) 1000 MG tablet Take by mouth.    [provider]  pioglitazone (ACTOS) 15 MG tablet Take by mouth. 01/08/20 01/07/21  [provider]  repaglinide (PRANDIN) 2 MG tablet Take by mouth.    [provider]  Vibegron (GEMTESA) 75 MG TABS Take 75 mg by mouth daily. 04/26/20   Alfredo Martinez, MD    Allergies Patient has no known allergies.  History reviewed. No pertinent family history.  Social History Social History   Tobacco Use  . Smoking status: Never Smoker  . Smokeless tobacco: Never Used  Substance Use Topics  . Alcohol use: Never  . Drug use: Never    Review of Systems  Review of Systems  Constitutional: Positive for fatigue. Negative for chills and fever.  HENT: Negative for sore throat.   Respiratory: Negative for shortness of breath.   Cardiovascular: Positive for palpitations. Negative for chest pain.  Gastrointestinal: Negative for abdominal pain.  Genitourinary: Negative for flank pain.  Musculoskeletal: Negative for neck pain.  Skin: Negative for rash and wound.  Allergic/Immunologic: Negative for immunocompromised state.  Neurological: Positive for weakness and light-headedness. Negative for numbness.  Hematological: Does not bruise/bleed easily.  All other systems reviewed and are negative.    ____________________________________________  PHYSICAL EXAM:      VITAL SIGNS: ED Triage  Vitals  Enc Vitals Group     BP 07/17/20 1326 118/63     Pulse Rate 07/17/20 1326 95     Resp 07/17/20 1326 18     Temp 07/17/20 1326 97.7 F (36.5 C)     Temp Source 07/17/20 1326 Oral     SpO2 07/17/20 1326 93 %     Weight 07/17/20 1327 190 lb (86.2 kg)     Height 07/17/20 1327 5\' 8"  (1.727 m)     Head Circumference --      Peak Flow --      Pain Score 07/17/20 1328 0     Pain Loc --      Pain Edu? --      Excl. in GC? --      Physical Exam Vitals and nursing note reviewed.  Constitutional:      General: He is not in acute distress.    Appearance: He is well-developed.  HENT:     Head: Normocephalic and atraumatic.     Mouth/Throat:     Mouth: Mucous membranes are dry.  Eyes:     Conjunctiva/sclera: Conjunctivae normal.  Cardiovascular:     Rate and Rhythm: Tachycardia present. Rhythm irregular.     Heart sounds: Normal heart sounds. No murmur heard. No friction rub.  Pulmonary:     Effort: Pulmonary effort is normal. No respiratory distress.     Breath sounds: Normal breath sounds. No wheezing or rales.  Abdominal:     General: There is no distension.     Palpations: Abdomen is soft.     Tenderness: There is no abdominal tenderness.  Musculoskeletal:     Cervical back: Neck supple.  Skin:    General: Skin is warm.     Capillary Refill: Capillary refill takes less than 2 seconds.  Neurological:     Mental Status: He is alert and oriented to person, place, and time.     Motor: No abnormal muscle tone.       ____________________________________________   LABS (all labs ordered are listed, but only abnormal results are displayed)  Labs Reviewed  CBC WITH DIFFERENTIAL/PLATELET - Abnormal; Notable for the following components:      Result Value   Hemoglobin 10.2 (*)    HCT 33.2 (*)    MCV 70.9 (*)    MCH 21.8 (*)    RDW 16.7 (*)    nRBC 0.8 (*)    Abs Immature Granulocytes 0.30 (*)    All other components within normal limits  COMPREHENSIVE METABOLIC  PANEL - Abnormal; Notable for the following components:   Potassium 3.4 (*)    CO2 21 (*)    Glucose, Bld 260 (*)    BUN 27 (*)    Creatinine, Ser 1.28 (*)    Calcium 8.7 (*)    Albumin 3.3 (*)    All other components within normal limits  LACTIC ACID, PLASMA - Abnormal; Notable for the following components:   Lactic Acid, Venous 2.8 (*)    All other components within normal limits  RESP PANEL BY RT-PCR (  FLU A&B, COVID) ARPGX2  LIPASE, BLOOD  LACTIC ACID, PLASMA  TSH  TROPONIN I (HIGH SENSITIVITY)  TROPONIN I (HIGH SENSITIVITY)    ____________________________________________  EKG: Atrial fibrillation, ventricular rate 101.  QRS 96, QTc 476.  Nonspecific T wave changes.  No acute ST elevations or depression.  No EKG evidence of acute ischemia or infarct. ________________________________________  RADIOLOGY All imaging, including plain films, CT scans, and ultrasounds, independently reviewed by me, and interpretations confirmed via formal radiology reads.  ED MD interpretation:   CT abdomen/pelvis: No acute abnormality  Official radiology report(s): CT ABDOMEN PELVIS WO CONTRAST  Result Date: 07/17/2020 CLINICAL DATA:  Diarrhea and vomiting for 2 days EXAM: CT ABDOMEN AND PELVIS WITHOUT CONTRAST TECHNIQUE: Multidetector CT imaging of the abdomen and pelvis was performed following the standard protocol without IV contrast. COMPARISON:  None. FINDINGS: Lower chest: No acute abnormality. Hepatobiliary: No solid liver abnormality is seen. No gallstones, gallbladder wall thickening, or biliary dilatation. Pancreas: Unremarkable. No pancreatic ductal dilatation or surrounding inflammatory changes. Spleen: Normal in size without significant abnormality. Adrenals/Urinary Tract: Adrenal glands are unremarkable. Fluid attenuation cyst of the superior pole left kidney. Kidneys are otherwise normal, without renal calculi, solid lesion, or hydronephrosis. Bladder is unremarkable. Stomach/Bowel:  Stomach is within normal limits. Appendix appears normal. No evidence of bowel wall thickening, distention, or inflammatory changes. Descending and sigmoid diverticulosis. Vascular/Lymphatic: Aortic atherosclerosis. No enlarged abdominal or pelvic lymph nodes. Reproductive: No mass or other significant abnormality. Other: Evidence of prior left inguinal hernia repair. No abdominopelvic ascites. Musculoskeletal: No acute or significant osseous findings. IMPRESSION: 1. No acute noncontrast CT findings of the abdomen or pelvis to explain diarrhea. 2. Descending and sigmoid diverticulosis without evidence of acute diverticulitis. 3. Evidence of prior left inguinal hernia repair. Aortic Atherosclerosis (ICD10-I70.0). Electronically Signed   By: Lauralyn Primes M.D.   On: 07/17/2020 14:52    ____________________________________________  PROCEDURES   Procedure(s) performed (including Critical Care):  .1-3 Lead EKG Interpretation Performed by: Shaune Pollack, MD Authorized by: Shaune Pollack, MD     Interpretation: abnormal     ECG rate:  90-120   ECG rate assessment: tachycardic     Rhythm: atrial fibrillation     Ectopy: none     Conduction: normal   Comments:     Indication: afib, weakness    ____________________________________________  INITIAL IMPRESSION / MDM / ASSESSMENT AND PLAN / ED COURSE  As part of my medical decision making, I reviewed the following data within the electronic MEDICAL RECORD NUMBER Nursing notes reviewed and incorporated, Old chart reviewed, Notes from prior ED visits, and [place] Controlled Substance Database       *Yidel Teuscher was evaluated in Emergency Department on 07/17/2020 for the symptoms described in the history of present illness. He was evaluated in the context of the global COVID-19 pandemic, which necessitated consideration that the patient might be at risk for infection with the SARS-CoV-2 virus that causes COVID-19. Institutional protocols and  algorithms that pertain to the evaluation of patients at risk for COVID-19 are in a state of rapid change based on information released by regulatory bodies including the CDC and federal and state organizations. These policies and algorithms were followed during the patient's care in the ED.  Some ED evaluations and interventions may be delayed as a result of limited staffing during the pandemic.*     Medical Decision Making: 70 year old male here with generalized weakness.  Suspect new onset atrial fibrillation, likely in the setting of dehydration due  to colchicine adverse effect.  Patient has mild diffuse abdominal tenderness but no fever or signs of infection.  CT scan reviewed and is unremarkable.  Lab work shows likely mild dehydration as well as lactic acid elevation.  No significant abdominal pain or signs of mesenteric ischemia.  Patient remains in atrial fibrillation despite fluids, and remains weak with orthostasis.  Will plan to admit for management of dehydration and new onset atrial fibrillation.  ____________________________________________  FINAL CLINICAL IMPRESSION(S) / ED DIAGNOSES  Final diagnoses:  Dehydration  New onset atrial fibrillation (HCC)     MEDICATIONS GIVEN DURING THIS VISIT:  Medications  sodium chloride 0.9 % bolus 1,000 mL (0 mLs Intravenous Stopped 07/17/20 1540)  sodium chloride 0.9 % bolus 1,000 mL (0 mLs Intravenous Stopped 07/17/20 1540)     ED Discharge Orders    None       Note:  This document was prepared using Dragon voice recognition software and may include unintentional dictation errors.   Shaune Pollack, MD 07/17/20 [phone]

## 2020-07-17 NOTE — ED Triage Notes (Signed)
Dr Erma Heritage at bedside at this time.  Per EMS report, pt arrives from home for c/o weakness x 2 days with 2 falls. Pt sts he became dizzy and fell onto his bottom. Pt denies hitting head, LOC, or injury from fall. Pt reports starting colchicine 2 days ago. Pt reports in past 2 days, he has had 1 episode of vomiting and 3-4 episodes of diarrhea. Denies black or bloody stool.  Moves all extremities equally, speech clear, no facial droop.

## 2020-07-17 NOTE — ED Notes (Signed)
Sent message to MD Agbata regarding trop 206

## 2020-07-18 ENCOUNTER — Inpatient Hospital Stay: Admit: 2020-07-18 | Payer: Medicare HMO

## 2020-07-18 DIAGNOSIS — R112 Nausea with vomiting, unspecified: Secondary | ICD-10-CM

## 2020-07-18 DIAGNOSIS — N179 Acute kidney failure, unspecified: Secondary | ICD-10-CM | POA: Diagnosis not present

## 2020-07-18 DIAGNOSIS — I480 Paroxysmal atrial fibrillation: Secondary | ICD-10-CM | POA: Diagnosis present

## 2020-07-18 DIAGNOSIS — E785 Hyperlipidemia, unspecified: Secondary | ICD-10-CM

## 2020-07-18 DIAGNOSIS — E872 Acidosis: Secondary | ICD-10-CM

## 2020-07-18 DIAGNOSIS — R778 Other specified abnormalities of plasma proteins: Secondary | ICD-10-CM

## 2020-07-18 DIAGNOSIS — E1169 Type 2 diabetes mellitus with other specified complication: Secondary | ICD-10-CM

## 2020-07-18 DIAGNOSIS — I4891 Unspecified atrial fibrillation: Secondary | ICD-10-CM | POA: Diagnosis not present

## 2020-07-18 DIAGNOSIS — R55 Syncope and collapse: Secondary | ICD-10-CM

## 2020-07-18 DIAGNOSIS — E876 Hypokalemia: Secondary | ICD-10-CM

## 2020-07-18 LAB — HIV ANTIBODY (ROUTINE TESTING W REFLEX): HIV Screen 4th Generation wRfx: NONREACTIVE

## 2020-07-18 LAB — CBC
HCT: 32.8 % — ABNORMAL LOW (ref 39.0–52.0)
Hemoglobin: 10.1 g/dL — ABNORMAL LOW (ref 13.0–17.0)
MCH: 21.6 pg — ABNORMAL LOW (ref 26.0–34.0)
MCHC: 30.8 g/dL (ref 30.0–36.0)
MCV: 70.1 fL — ABNORMAL LOW (ref 80.0–100.0)
Platelets: 318 10*3/uL (ref 150–400)
RBC: 4.68 MIL/uL (ref 4.22–5.81)
RDW: 16.7 % — ABNORMAL HIGH (ref 11.5–15.5)
WBC: 9.5 10*3/uL (ref 4.0–10.5)
nRBC: 0.7 % — ABNORMAL HIGH (ref 0.0–0.2)

## 2020-07-18 LAB — BASIC METABOLIC PANEL
Anion gap: 8 (ref 5–15)
BUN: 19 mg/dL (ref 8–23)
CO2: 21 mmol/L — ABNORMAL LOW (ref 22–32)
Calcium: 8.7 mg/dL — ABNORMAL LOW (ref 8.9–10.3)
Chloride: 112 mmol/L — ABNORMAL HIGH (ref 98–111)
Creatinine, Ser: 0.98 mg/dL (ref 0.61–1.24)
GFR, Estimated: >60 mL/min (ref >60)
Glucose, Bld: 83 mg/dL (ref 70–99)
Potassium: 3.4 mmol/L — ABNORMAL LOW (ref 3.5–5.1)
Sodium: 141 mmol/L (ref 135–145)

## 2020-07-18 LAB — GLUCOSE, CAPILLARY: Glucose-Capillary: 110 mg/dL — ABNORMAL HIGH (ref 70–99)

## 2020-07-18 MED ORDER — CARVEDILOL 12.5 MG PO TABS: 12.5000 mg | ORAL_TABLET | Freq: Two times a day (BID) | ORAL | Status: DC

## 2020-07-18 MED ORDER — CARVEDILOL 6.25 MG PO TABS
6.2500 mg | ORAL_TABLET | Freq: Once | ORAL | Status: AC
  Administered 2020-07-18: 6.25 mg via ORAL
  Filled 2020-07-18: qty 1

## 2020-07-18 MED ORDER — POTASSIUM CHLORIDE CRYS ER 20 MEQ PO TBCR
20.0000 meq | EXTENDED_RELEASE_TABLET | Freq: Once | ORAL | Status: AC
  Administered 2020-07-18: 20 meq via ORAL
  Filled 2020-07-18: qty 1

## 2020-07-18 MED ORDER — CARVEDILOL 12.5 MG PO TABS: 12.5000 mg | ORAL_TABLET | Freq: Two times a day (BID) | ORAL | 0 refills | Status: AC

## 2020-07-18 MED ORDER — DILTIAZEM HCL ER COATED BEADS 120 MG PO CP24: 120.0000 mg | ORAL_CAPSULE | Freq: Every day | ORAL | 0 refills | Status: AC

## 2020-07-18 MED ORDER — DILTIAZEM HCL ER COATED BEADS 120 MG PO CP24
120.0000 mg | ORAL_CAPSULE | Freq: Every day | ORAL | Status: DC
  Administered 2020-07-18: 120 mg via ORAL
  Filled 2020-07-18: qty 1

## 2020-07-18 MED ORDER — APIXABAN 5 MG PO TABS: 5.0000 mg | ORAL_TABLET | Freq: Two times a day (BID) | ORAL | 0 refills | Status: AC

## 2020-07-18 NOTE — TOC Initial Note (Signed)
Transition of Care [facility]) - Initial/Assessment Note    Patient Details  Name: Darryl Stafford MRN: [id number] Date of Birth: [birth year]  Transition of Care [facility]) CM/SW Contact:    Chapman Fitch, RN Phone Number: 07/18/2020, 10:19 AM  Clinical Narrative:                  Patient to discharge today Wife to transport at discharge  Printed Eliquis 30 day coupon and Medicare obs notice to unit.  Bedside RN to place in discharge packet.  Patient aware        Patient Goals and CMS Choice        Expected Discharge Plan and Services           Expected Discharge Date: 07/18/20                                    Prior Living Arrangements/Services                       Activities of Daily Living Home Assistive Devices/Equipment: None ADL Screening (condition at time of admission) Patient's cognitive ability adequate to safely complete daily activities?: Yes Is the patient deaf or have difficulty hearing?: No Does the patient have difficulty seeing, even when wearing glasses/contacts?: No Does the patient have difficulty concentrating, remembering, or making decisions?: No Patient able to express need for assistance with ADLs?: Yes Does the patient have difficulty dressing or bathing?: No Independently performs ADLs?: Yes (appropriate for developmental age) Does the patient have difficulty walking or climbing stairs?: No Weakness of Legs: None Weakness of Arms/Hands: None  Permission Sought/Granted                  Emotional Assessment              Admission diagnosis:  Dehydration [E86.0] New onset atrial fibrillation (HCC) [I48.91] Atrial fibrillation with rapid ventricular response (HCC) [I48.91] Paroxysmal atrial fibrillation (HCC) [I48.0] Patient Active Problem List   Diagnosis Date Noted  . Paroxysmal atrial fibrillation (HCC) 07/18/2020  . Atrial fibrillation with rapid ventricular response (HCC) 07/17/2020  . Gout   . Type 2  diabetes mellitus without complication (HCC)   . Postural dizziness with near syncope   . Essential hypertension   . Lactic acidosis   . Hypokalemia   . Hypothyroidism   . Elevated troponin I level    PCP:  Wilford Corner, PA-C Pharmacy:   [facility] [phone] [address], [facility] - [address] [place] [address] Phone: [phone] Fax: [phone]     Social Determinants of Health (SDOH) Interventions    Readmission Risk Interventions No flowsheet data found.

## 2020-07-18 NOTE — Plan of Care (Signed)
  Problem: Education: Goal: Knowledge of condition and prescribed therapy will improve Outcome: Adequate for Discharge   Problem: Cardiac: Goal: Will achieve and/or maintain adequate cardiac output Outcome: Adequate for Discharge   Problem: Physical Regulation: Goal: Complications related to the disease process, condition or treatment will be avoided or minimized Outcome: Adequate for Discharge   

## 2020-07-18 NOTE — Care Management Obs Status (Signed)
MEDICARE OBSERVATION STATUS NOTIFICATION   Patient Details  Name: Darryl Stafford MRN: [id number] Date of Birth: 22-Jun-1950   Medicare Observation Status Notification Given:  Yes    Chapman Fitch, RN 07/18/2020, 9:43 AM

## 2020-07-18 NOTE — Discharge Instructions (Signed)

## 2020-07-18 NOTE — Care Management CC44 (Signed)
Condition Code 44 Documentation Completed  Patient Details  Name: Jahbari Repinski MRN: [id number] Date of Birth: 31-Oct-1950   Condition Code 44 given:  Yes Patient signature on Condition Code 44 notice:  Yes Documentation of 2 MD's agreement:  Yes Code 44 added to claim:  Yes    Chapman Fitch, RN 07/18/2020, 9:43 AM

## 2020-07-18 NOTE — Discharge Summary (Signed)
[facility] [place] [facility]   PATIENT NAME: Darryl Stafford    MR#:  [id number]  DATE OF BIRTH:  1950-06-01  DATE OF ADMISSION:  07/17/2020 ADMITTING PHYSICIAN: Darryl Highland, MD  DATE OF DISCHARGE: 07/18/2020 11:55 AM  PRIMARY CARE PHYSICIAN: Wilford Corner, PA-C    ADMISSION DIAGNOSIS:  Dehydration [E86.0] New onset atrial fibrillation (HCC) [I48.91] Atrial fibrillation with rapid ventricular response (HCC) [I48.91] Paroxysmal atrial fibrillation (HCC) [I48.0]  DISCHARGE DIAGNOSIS:  Principal Problem:   Atrial fibrillation with rapid ventricular response (HCC) Active Problems:   Gout   Type 2 diabetes mellitus without complication (HCC)   Postural dizziness with near syncope   Essential hypertension   Lactic acidosis   Hypokalemia   Hypothyroidism   Elevated troponin I level   Paroxysmal atrial fibrillation (HCC)   SECONDARY DIAGNOSIS:   Past Medical History:  Diagnosis Date  . Anemia   . Diabetes mellitus without complication (HCC)   . Gout   . Hypertension     HOSPITAL COURSE:   1.  Atrial fibrillation that converted over to normal sinus rhythm.  The patient was started on Eliquis by admitting doctor.  I will continue this upon discharge.  30-day free card given.  The patient takes Coreg 25 mg once a day at home.  I advised that this is a twice a day medication and will give 12.5 mg twice daily.  We will change his Norvasc over to Cardizem CD.  Will refer to cardiology as outpatient.  Echocardiogram done but not read yet. 2.  Nausea vomiting and diarrhea which has subsided patient thinks it may have been colchicine that have caused it.  This has resolved.  Patient feeling better today. 3.  Acute kidney injury and dehydration.  Creatinine 1.28 on presentation and down to 0.98.  Improved with fluids overnight. 4.  Lactic acidosis could be secondary to nausea vomiting and diarrhea or metformin.  No signs of infection. 5.  Near  syncope likely from nausea vomiting and diarrhea and dehydration and acute kidney injury. 6.  Hypokalemia replace potassium orally.  Hold hydrochlorothiazide for now. 7.  Type 2 diabetes mellitus with hyperlipidemia.  Can continue Actos and Jardiance.  Hold metformin and Prandin.  Continue atorvastatin. 8.  Elevated troponin likely demand ischemia from fast heart rate.  No chest pain or shortness of breath. 9.  History of gout on prednisone and .0.  DISCHARGE CONDITIONS:  Satisfactory  CONSULTS OBTAINED:  None  DRUG ALLERGIES:  No Known Allergies  DISCHARGE MEDICATIONS:   Allergies as of 07/18/2020   No Known Allergies     Medication List    STOP taking these medications   amLODipine 10 MG tablet Commonly known as: NORVASC   ibuprofen 800 MG tablet Commonly known as: ADVIL   lisinopril-hydrochlorothiazide 20-25 MG tablet Commonly known as: ZESTORETIC   metFORMIN 1000 MG tablet Commonly known as: GLUCOPHAGE   repaglinide 2 MG tablet Commonly known as: PRANDIN     TAKE these medications   allopurinol 100 MG tablet Commonly known as: ZYLOPRIM Take 100 mg by mouth daily.   apixaban 5 MG Tabs tablet Commonly known as: ELIQUIS Take 1 tablet (5 mg total) by mouth 2 (two) times daily.   atorvastatin 80 MG tablet Commonly known as: LIPITOR Take 1 tablet by mouth daily. What changed: Another medication with the same name was removed. Continue taking this medication, and follow the directions you see here.   carvedilol 12.5 MG tablet Commonly known as:  COREG Take 1 tablet (12.5 mg total) by mouth 2 (two) times daily with a meal. What changed:   medication strength  how much to take  when to take this   diltiazem 120 MG 24 hr capsule Commonly known as: CARDIZEM CD Take 1 capsule (120 mg total) by mouth daily.   empagliflozin 25 MG Tabs tablet Commonly known as: JARDIANCE Take by mouth.   gemfibrozil 600 MG tablet Commonly known as: LOPID Take by mouth.    Gemtesa 75 MG Tabs Generic drug: Vibegron Take 75 mg by mouth daily.   levothyroxine 25 MCG tablet Commonly known as: SYNTHROID Take 1 tablet by mouth daily.   pioglitazone 15 MG tablet Commonly known as: ACTOS Take by mouth.   predniSONE 5 MG tablet Commonly known as: DELTASONE Take by mouth.        DISCHARGE INSTRUCTIONS:   Follow-up PMD 5 days Follow-up cardiology 1 week  If you experience worsening of your admission symptoms, develop shortness of breath, life threatening emergency, suicidal or homicidal thoughts you must seek medical attention immediately by calling 911 or calling your MD immediately  if symptoms less severe.  You Must read complete instructions/literature along with all the possible adverse reactions/side effects for all the Medicines you take and that have been prescribed to you. Take any new Medicines after you have completely understood and accept all the possible adverse reactions/side effects.   Please note  You were cared for by a hospitalist during your hospital stay. If you have any questions about your discharge medications or the care you received while you were in the hospital after you are discharged, you can call the unit and asked to speak with the hospitalist on call if the hospitalist that took care of you is not available. Once you are discharged, your primary care physician will handle any further medical issues. Please note that NO REFILLS for any discharge medications will be authorized once you are discharged, as it is imperative that you return to your primary care physician (or establish a relationship with a primary care physician if you do not have one) for your aftercare needs so that they can reassess your need for medications and monitor your lab values.    Today   CHIEF COMPLAINT:   Chief Complaint  Patient presents with  . Weakness    HISTORY OF PRESENT ILLNESS:  Darryl Stafford  is a 70 y.o. male came in with  weakness nausea vomiting and diarrhea and near syncope.   VITAL SIGNS:  Blood pressure 134/77, pulse 82, temperature 97.8 F (36.6 C), resp. rate 18, height 5\' 8"  (1.727 m), weight 87 kg, SpO2 95 %.  I/O:    Intake/Output Summary (Last 24 hours) at 07/18/2020 1519 Last data filed at 07/18/2020 0956 Gross per 24 hour  Intake 2483 ml  Output 2600 ml  Net -117 ml    PHYSICAL EXAMINATION:  GENERAL:  70 y.o.-year-old patient lying in the bed with no acute distress.  EYES: Pupils equal, round, reactive to light and accommodation. No scleral icterus. Extraocular muscles intact.  HEENT: Head atraumatic, normocephalic. Oropharynx and nasopharynx clear.   LUNGS: Normal breath sounds bilaterally, no wheezing, rales,rhonchi or crepitation. No use of accessory muscles of respiration.  CARDIOVASCULAR: S1, S2 normal. No murmurs, rubs, or gallops.  ABDOMEN: Soft, non-tender, non-distended. Bowel sounds present. No organomegaly or mass.  EXTREMITIES: No pedal edema.  NEUROLOGIC: Cranial nerves II through XII are intact. Muscle strength 5/5 in all extremities. Sensation intact. Gait  not checked.  PSYCHIATRIC: The patient is alert and oriented x 3.  SKIN: No obvious rash, lesion, or ulcer.   DATA REVIEW:   CBC Recent Labs  Lab 07/18/20 0418  WBC 9.5  HGB 10.1*  HCT 32.8*  PLT 318    Chemistries  Recent Labs  Lab 07/17/20 1332 07/17/20 1558 07/18/20 0418  NA 137  --  141  K 3.4*  --  3.4*  CL 105  --  112*  CO2 21*  --  21*  GLUCOSE 260*  --  83  BUN 27*  --  19  CREATININE 1.28*  --  0.98  CALCIUM 8.7*  --  8.7*  MG  --  2.2  --   AST 21  --   --   ALT 20  --   --   ALKPHOS 82  --   --   BILITOT 0.8  --   --    Microbiology Results  Results for orders placed or performed during the hospital encounter of 07/17/20  Resp Panel by RT-PCR (Flu A&B, Covid) Nasopharyngeal Swab     Status: None   Collection Time: 07/17/20  3:57 PM   Specimen: Nasopharyngeal Swab;  Nasopharyngeal(NP) swabs in vial transport medium  Result Value Ref Range Status   SARS Coronavirus 2 by RT PCR NEGATIVE NEGATIVE Final    Comment: (NOTE) SARS-CoV-2 target nucleic acids are NOT DETECTED.  The SARS-CoV-2 RNA is generally detectable in upper respiratory specimens during the acute phase of infection. The lowest concentration of SARS-CoV-2 viral copies this assay can detect is 138 copies/mL. A negative result does not preclude SARS-Cov-2 infection and should not be used as the sole basis for treatment or other patient management decisions. A negative result may occur with  improper specimen collection/handling, submission of specimen other than nasopharyngeal swab, presence of viral mutation(s) within the areas targeted by this assay, and inadequate number of viral copies(<138 copies/mL). A negative result must be combined with clinical observations, patient history, and epidemiological information. The expected result is Negative.  Fact Sheet for Patients:  BloggerCourse.comhttps://www.fda.gov/media/152166/download  Fact Sheet for Healthcare Providers:  SeriousBroker.ithttps://www.fda.gov/media/152162/download  This test is no t yet approved or cleared by the [place] FDA and  has been authorized for detection and/or diagnosis of SARS-CoV-2 by FDA under an Emergency Use Authorization (EUA). This EUA will remain  in effect (meaning this test can be used) for the duration of the COVID-19 declaration under Section 564(b)(1) of the Act, 21 U.S.C.section 360bbb-3(b)(1), unless the authorization is terminated  or revoked sooner.       Influenza A by PCR NEGATIVE NEGATIVE Final   Influenza B by PCR NEGATIVE NEGATIVE Final    Comment: (NOTE) The Xpert Xpress SARS-CoV-2/FLU/RSV plus assay is intended as an aid in the diagnosis of influenza from Nasopharyngeal swab specimens and should not be used as a sole basis for treatment. Nasal washings and aspirates are unacceptable for Xpert Xpress  SARS-CoV-2/FLU/RSV testing.  Fact Sheet for Patients: BloggerCourse.comhttps://www.fda.gov/media/152166/download  Fact Sheet for Healthcare Providers: SeriousBroker.ithttps://www.fda.gov/media/152162/download  This test is not yet approved or cleared by the [place] FDA and has been authorized for detection and/or diagnosis of SARS-CoV-2 by FDA under an Emergency Use Authorization (EUA). This EUA will remain in effect (meaning this test can be used) for the duration of the COVID-19 declaration under Section 564(b)(1) of the Act, 21 U.S.C. section 360bbb-3(b)(1), unless the authorization is terminated or revoked.  Performed at [facility], [address]  [place], [place] [address]     RADIOLOGY:  CT ABDOMEN PELVIS WO CONTRAST  Result Date: 07/17/2020 CLINICAL DATA:  Diarrhea and vomiting for 2 days EXAM: CT ABDOMEN AND PELVIS WITHOUT CONTRAST TECHNIQUE: Multidetector CT imaging of the abdomen and pelvis was performed following the standard protocol without IV contrast. COMPARISON:  None. FINDINGS: Lower chest: No acute abnormality. Hepatobiliary: No solid liver abnormality is seen. No gallstones, gallbladder wall thickening, or biliary dilatation. Pancreas: Unremarkable. No pancreatic ductal dilatation or surrounding inflammatory changes. Spleen: Normal in size without significant abnormality. Adrenals/Urinary Tract: Adrenal glands are unremarkable. Fluid attenuation cyst of the superior pole left kidney. Kidneys are otherwise normal, without renal calculi, solid lesion, or hydronephrosis. Bladder is unremarkable. Stomach/Bowel: Stomach is within normal limits. Appendix appears normal. No evidence of bowel wall thickening, distention, or inflammatory changes. Descending and sigmoid diverticulosis. Vascular/Lymphatic: Aortic atherosclerosis. No enlarged abdominal or pelvic lymph nodes. Reproductive: No mass or other significant abnormality. Other: Evidence of prior left inguinal hernia repair. No  abdominopelvic ascites. Musculoskeletal: No acute or significant osseous findings. IMPRESSION: 1. No acute noncontrast CT findings of the abdomen or pelvis to explain diarrhea. 2. Descending and sigmoid diverticulosis without evidence of acute diverticulitis. 3. Evidence of prior left inguinal hernia repair. Aortic Atherosclerosis (ICD10-I70.0). Electronically Signed   By: Lauralyn Primes M.D.   On: 07/17/2020 14:52     Management plans discussed with the patient, family and they are in agreement.  CODE STATUS:     Code Status Orders  (From admission, onward)         Start     Ordered   07/17/20 1611  Full code  Continuous        07/17/20 1612        Code Status History    This patient has a current code status but no historical code status.   Advance Care Planning Activity      TOTAL TIME TAKING CARE OF THIS PATIENT: 35 minutes.    Darryl Highland M.D on 07/18/2020 at 3:19 PM  Between 7am to 6pm - Pager - [phone]  After 6pm go to [email or web address] - password [email or web address]  [facility]  CC: Primary care physician; Whitaker, [employer], PA-C

## 2021-05-02 ENCOUNTER — Ambulatory Visit: Payer: Self-pay | Admitting: Urology

## 2021-05-02 ENCOUNTER — Encounter: Payer: Self-pay | Admitting: Urology

## 2022-08-21 IMAGING — CT CT ABD-PELV W/O CM
2 of 4 series · 16 of 46 positions shown, 18 images · non-contrast
Comparison: None.

CLINICAL DATA: Diarrhea and vomiting for 2 days

EXAM:
CT ABDOMEN AND PELVIS WITHOUT CONTRAST
TECHNIQUE: Multidetector CT imaging of the abdomen and pelvis was performed
following the standard protocol without IV contrast.

[Series 2: routine abd/pel wo · axial · 0.80mm/px · z∈[-510,-76]mm · 13 of 95 slices shown, 15 images]
[im 4/95  soft-tissue]
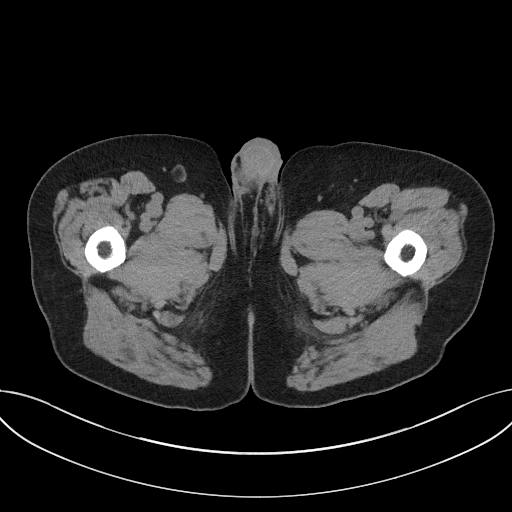
[im 4/95  bone]
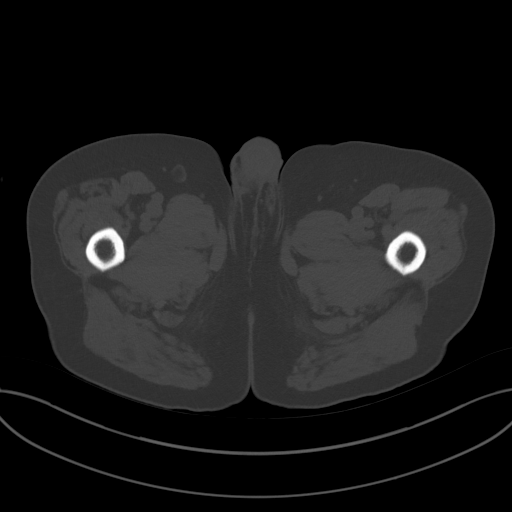
[im 12/95  soft-tissue]
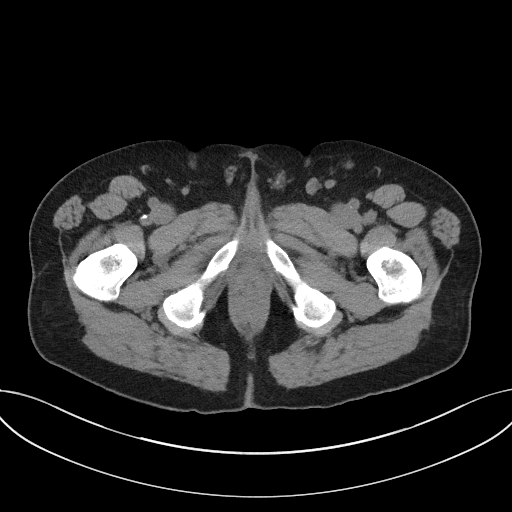
[im 19/95  soft-tissue]
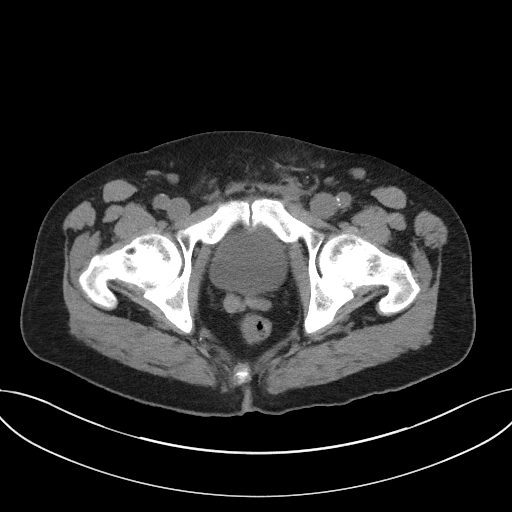
[im 27/95  soft-tissue]
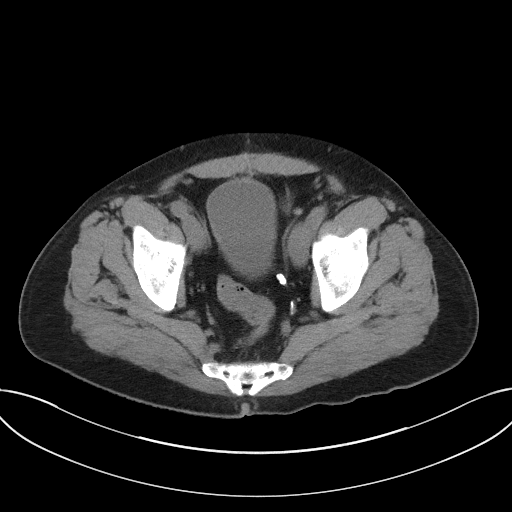
[im 34/95  soft-tissue]
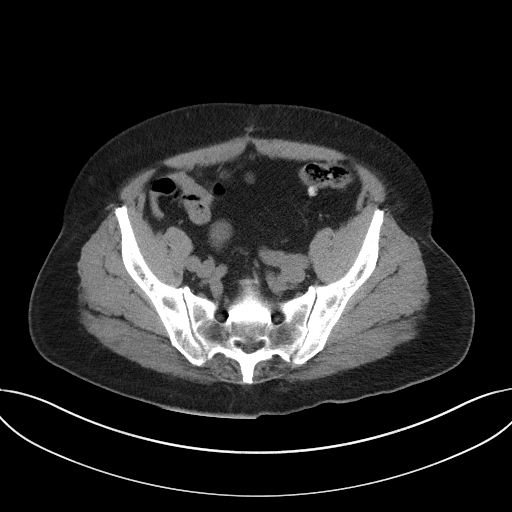
[im 42/95  soft-tissue]
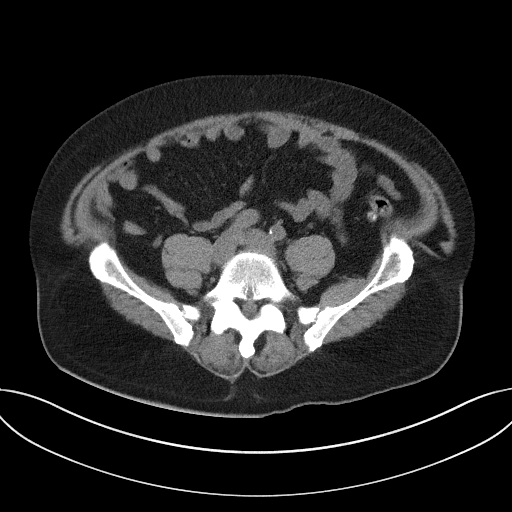
[im 49/95  soft-tissue]
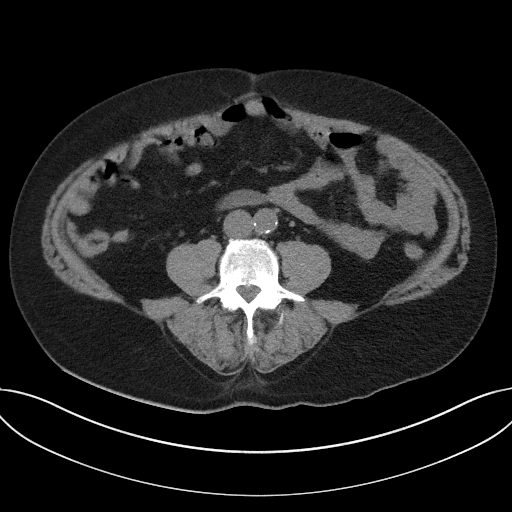
[im 53/95  soft-tissue]
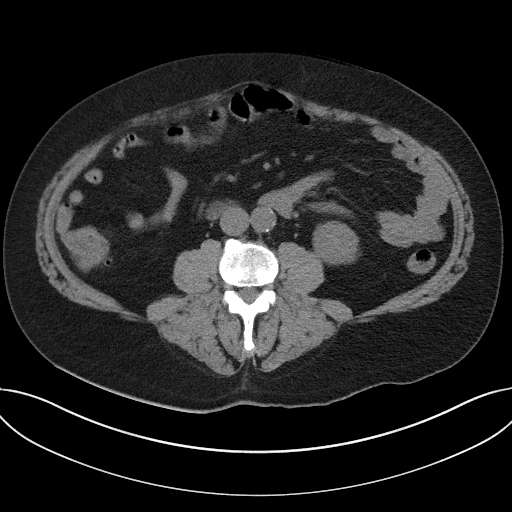
[im 61/95  soft-tissue]
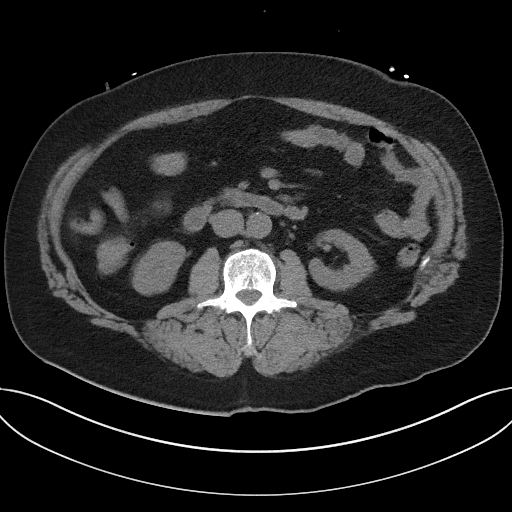
[im 61/95  bone]
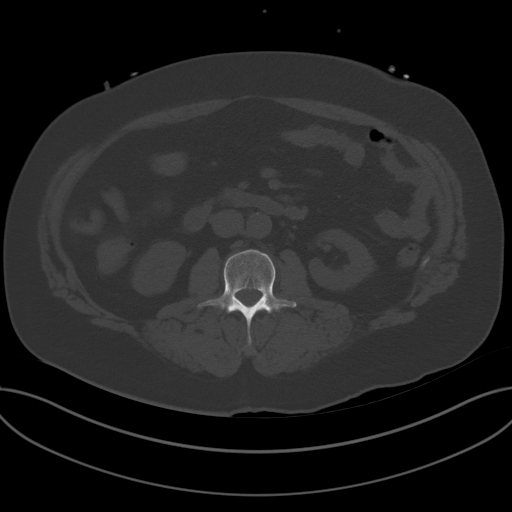
[im 68/95  soft-tissue]
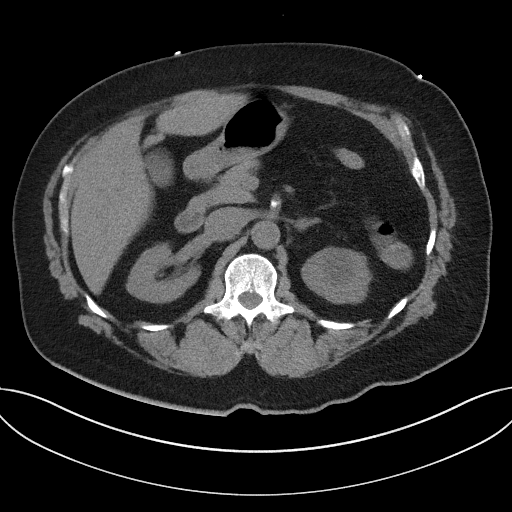
[im 76/95  soft-tissue]
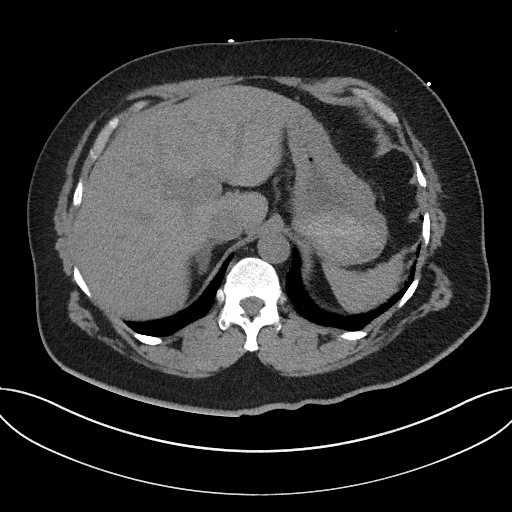
[im 83/95  soft-tissue]
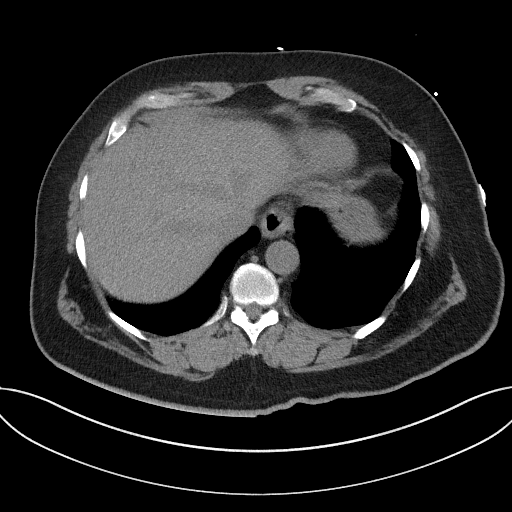
[im 91/95  soft-tissue]
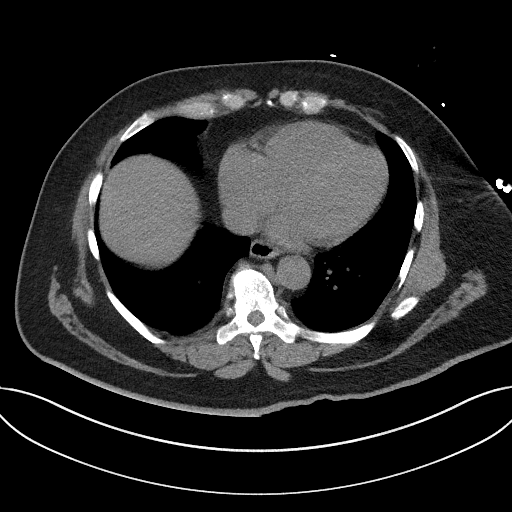

[Series 5: coronal st · coronal · 0.76mm/px · 3 of 103 slices shown]
[im 35/103  soft-tissue]
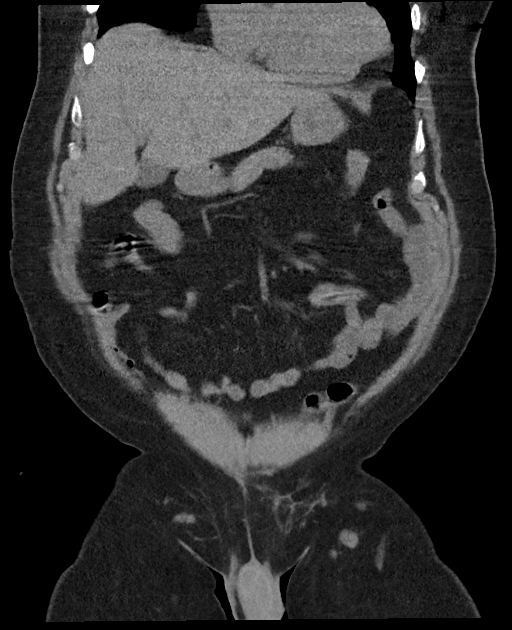
[im 46/103  soft-tissue]
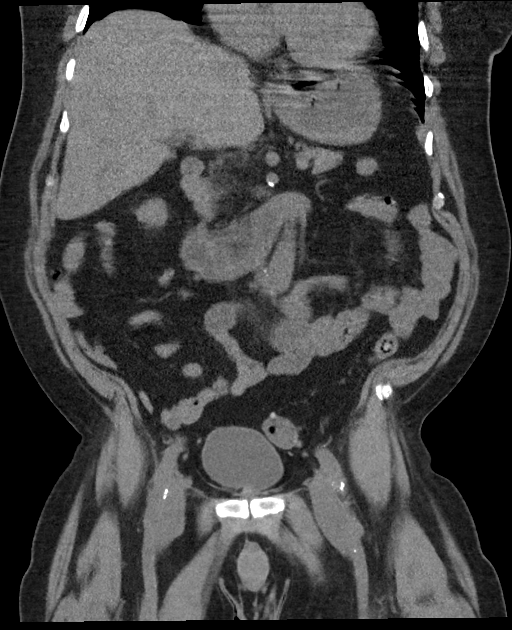
[im 57/103  soft-tissue]
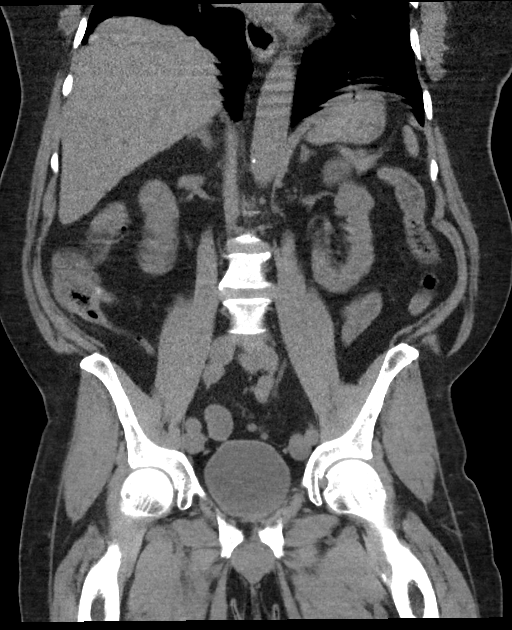

[16 of 46 positions shown; findings below may reference images not displayed]

FINDINGS: Lower chest: No acute abnormality.

Hepatobiliary: No solid liver abnormality is seen. No gallstones,
gallbladder wall thickening, or biliary dilatation.

Pancreas: Unremarkable. No pancreatic ductal dilatation or
surrounding inflammatory changes.

Spleen: Normal in size without significant abnormality.

Adrenals/Urinary Tract: Adrenal glands are unremarkable. Fluid
attenuation cyst of the superior pole left kidney. Kidneys are
otherwise normal, without renal calculi, solid lesion, or
hydronephrosis. Bladder is unremarkable.

Stomach/Bowel: Stomach is within normal limits. Appendix appears
normal. No evidence of bowel wall thickening, distention, or
inflammatory changes. Descending and sigmoid diverticulosis.

Vascular/Lymphatic: Aortic atherosclerosis. No enlarged abdominal or
pelvic lymph nodes.

Reproductive: No mass or other significant abnormality.

Other: Evidence of prior left inguinal hernia repair. No
abdominopelvic ascites.

Musculoskeletal: No acute or significant osseous findings.
IMPRESSION: 1. No acute noncontrast CT findings of the abdomen or pelvis to
explain diarrhea.
2. Descending and sigmoid diverticulosis without evidence of acute
diverticulitis.
3. Evidence of prior left inguinal hernia repair.

Aortic Atherosclerosis ([id number]-RG5.5).
